# Patient Record
Sex: Female | Born: 1995
Health system: Southern US, Community
[De-identification: ages and names within clinical notes are randomized; demographics above are authoritative.]

---
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
## 2015-06-14 DIAGNOSIS — D2271 Melanocytic nevi of right lower limb, including hip: Secondary | ICD-10-CM | POA: Diagnosis not present

## 2015-06-14 DIAGNOSIS — D225 Melanocytic nevi of trunk: Secondary | ICD-10-CM | POA: Diagnosis not present

## 2015-06-14 DIAGNOSIS — D2272 Melanocytic nevi of left lower limb, including hip: Secondary | ICD-10-CM | POA: Diagnosis not present

## 2015-06-14 DIAGNOSIS — D2261 Melanocytic nevi of right upper limb, including shoulder: Secondary | ICD-10-CM | POA: Diagnosis not present

## 2015-06-14 DIAGNOSIS — D1801 Hemangioma of skin and subcutaneous tissue: Secondary | ICD-10-CM | POA: Diagnosis not present

## 2015-06-14 DIAGNOSIS — D224 Melanocytic nevi of scalp and neck: Secondary | ICD-10-CM | POA: Diagnosis not present

## 2015-06-14 DIAGNOSIS — D2262 Melanocytic nevi of left upper limb, including shoulder: Secondary | ICD-10-CM | POA: Diagnosis not present

## 2016-01-22 DIAGNOSIS — E039 Hypothyroidism, unspecified: Secondary | ICD-10-CM | POA: Diagnosis not present

## 2016-01-28 DIAGNOSIS — E039 Hypothyroidism, unspecified: Secondary | ICD-10-CM | POA: Diagnosis not present

## 2016-09-04 DIAGNOSIS — L723 Sebaceous cyst: Secondary | ICD-10-CM | POA: Diagnosis not present

## 2016-12-11 MED FILL — ARMOUR THYROID 60 MG TABLET: 60 | 30 days supply | Qty: 75 | Fill #0

## 2017-03-24 DIAGNOSIS — E559 Vitamin D deficiency, unspecified: Secondary | ICD-10-CM | POA: Diagnosis not present

## 2017-03-24 DIAGNOSIS — E039 Hypothyroidism, unspecified: Secondary | ICD-10-CM | POA: Diagnosis not present

## 2017-03-24 DIAGNOSIS — R5383 Other fatigue: Secondary | ICD-10-CM | POA: Diagnosis not present

## 2017-03-24 DIAGNOSIS — R7989 Other specified abnormal findings of blood chemistry: Secondary | ICD-10-CM | POA: Diagnosis not present

## 2017-05-11 DIAGNOSIS — M79645 Pain in left finger(s): Secondary | ICD-10-CM | POA: Diagnosis not present

## 2017-06-12 DIAGNOSIS — S99921A Unspecified injury of right foot, initial encounter: Secondary | ICD-10-CM | POA: Diagnosis not present

## 2017-06-24 DIAGNOSIS — L0390 Cellulitis, unspecified: Secondary | ICD-10-CM | POA: Diagnosis not present

## 2017-06-24 DIAGNOSIS — T63301A Toxic effect of unspecified spider venom, accidental (unintentional), initial encounter: Secondary | ICD-10-CM | POA: Diagnosis not present

## 2017-06-28 DIAGNOSIS — S30860A Insect bite (nonvenomous) of lower back and pelvis, initial encounter: Secondary | ICD-10-CM | POA: Diagnosis not present

## 2017-06-28 DIAGNOSIS — W57XXXA Bitten or stung by nonvenomous insect and other nonvenomous arthropods, initial encounter: Secondary | ICD-10-CM | POA: Diagnosis not present

## 2017-06-30 DIAGNOSIS — R21 Rash and other nonspecific skin eruption: Secondary | ICD-10-CM | POA: Diagnosis not present

## 2017-07-07 ENCOUNTER — Ambulatory Visit (INDEPENDENT_AMBULATORY_CARE_PROVIDER_SITE_OTHER): Payer: 59 | Admitting: Family Medicine

## 2017-07-07 ENCOUNTER — Encounter: Payer: Self-pay | Admitting: Family Medicine

## 2017-07-07 DIAGNOSIS — S8992XA Unspecified injury of left lower leg, initial encounter: Secondary | ICD-10-CM | POA: Diagnosis not present

## 2017-07-07 NOTE — Assessment & Plan Note (Signed)
2/2 hematoma and large contusion.  Compression icing, elevation.  Ibuprofen with tylenol as needed.  Motion exercises at least twice a day.  Get up hourly when on the plane and advised to take baby ASA each of the two days she's traveling.  F/u prn.

## 2017-07-07 NOTE — Patient Instructions (Signed)
Your ultrasound and exam are reassuring. You have a large contusion with a focal hematoma where you were struck. Compression when up and walking around. Icing 15 minutes at a time 3-4 times a day (up to every hour) - this is very important the first 72 hours but I would continue out to a week if you can. Ibuprofen 600mg  three times a day with food for pain and inflammation. Tylenol 500mg  1-2 tabs three times a day as needed. Make sure you get up every hour and walk in the plane. Motion exercises of ankle (up/downs and alphabet) twice a day at least. Call me if you need anything otherwise have a great trip!

## 2017-07-07 NOTE — Progress Notes (Signed)
PCP: No primary care provider on file.  Subjective:   HPI: Patient is a 22 y.o. female here for left leg injury.  Patient reports yesterday around 5:30-6pm she was playing ultimate frisbee when she collided with another player - their leg struck her very hard on medial left lower leg. Immediate bruising, swelling to this area. Able to bear weight but painful. Pain was up to 8/10 but now down to 3/10 with pushing off. Continues to ice and take advil. Planning to leave Monday for [place] for [employer]. No prior injuries. No numbness.  History reviewed. No pertinent past medical history.  Current Outpatient Medications on File Prior to Visit  Medication Sig Dispense Refill  . atovaquone-proguanil (MALARONE) 250-100 MG TABS tablet Take 1 tablet by mouth daily.  0  . clotrimazole (LOTRIMIN) 1 % external solution     . triamcinolone cream (KENALOG) 0.1 %      No current facility-administered medications on file prior to visit.     History reviewed. No pertinent surgical history.  Allergies  Allergen Reactions  . Peanut-Containing Drug Products Anaphylaxis    Social History   Socioeconomic History  . Marital status: Married    Spouse name: Not on file  . Number of children: Not on file  . Years of education: Not on file  . Highest education level: Not on file  Occupational History  . Not on file  Social Needs  . Financial resource strain: Not on file  . Food insecurity:    Worry: Not on file    Inability: Not on file  . Transportation needs:    Medical: Not on file    Non-medical: Not on file  Tobacco Use  . Smoking status: Not on file  Substance and Sexual Activity  . Alcohol use: Not on file  . Drug use: Not on file  . Sexual activity: Not on file  Lifestyle  . Physical activity:    Days per week: Not on file    Minutes per session: Not on file  . Stress: Not on file  Relationships  . Social connections:    Talks on phone: Not on file    Gets  together: Not on file    Attends religious service: Not on file    Active member of club or organization: Not on file    Attends meetings of clubs or organizations: Not on file    Relationship status: Not on file  . Intimate partner violence:    Fear of current or ex partner: Not on file    Emotionally abused: Not on file    Physically abused: Not on file    Forced sexual activity: Not on file  Other Topics Concern  . Not on file  Social History Narrative  . Not on file    History reviewed. No pertinent family history.  BP 122/82   Ht 5\' 6"  (1.676 m)   Wt 155 lb (70.3 kg)   BMI 25.02 kg/m   Review of Systems: See HPI above.     Objective:  Physical Exam:  Gen: NAD, comfortable in exam room  Left lower leg/ankle: Bruising throughout medial to anterior lower leg.  No other deformity. FROM ankle with 5/5 strength with minimal pain on calf raise. TTP throughout bruised area.  No rigidity. Negative ant drawer and talar tilt.   Negative syndesmotic compression. Thompsons test negative. NV intact distally.  Right lower leg/ankle: No deformity. FROM with 5/5 strength. No tenderness to palpation. NVI distally.  MSK u/s:  Venous structures compressible.  Small hematoma confirmed over medial gastroc.  No cortical irregularity, edema overlying cortex, neovascularity overlying tibial cortex.  Soft tissue swelling throughout over bruised area.  No abnormalities of gastroc or soleus.   Assessment & Plan:  1. Left leg injury - 2/2 hematoma and large contusion.  Compression icing, elevation.  Ibuprofen with tylenol as needed.  Motion exercises at least twice a day.  Get up hourly when on the plane and advised to take baby ASA each of the two days she's traveling.  F/u prn.

## 2018-09-28 DIAGNOSIS — E039 Hypothyroidism, unspecified: Secondary | ICD-10-CM | POA: Diagnosis not present

## 2018-09-28 DIAGNOSIS — Z0001 Encounter for general adult medical examination with abnormal findings: Secondary | ICD-10-CM | POA: Diagnosis not present

## 2021-01-07 DIAGNOSIS — E538 Deficiency of other specified B group vitamins: Secondary | ICD-10-CM | POA: Diagnosis not present

## 2021-01-07 DIAGNOSIS — E559 Vitamin D deficiency, unspecified: Secondary | ICD-10-CM | POA: Diagnosis not present

## 2021-01-07 DIAGNOSIS — Z0001 Encounter for general adult medical examination with abnormal findings: Secondary | ICD-10-CM | POA: Diagnosis not present

## 2021-01-07 DIAGNOSIS — E039 Hypothyroidism, unspecified: Secondary | ICD-10-CM | POA: Diagnosis not present

## 2021-04-03 DIAGNOSIS — L910 Hypertrophic scar: Secondary | ICD-10-CM | POA: Diagnosis not present

## 2021-05-09 DIAGNOSIS — L910 Hypertrophic scar: Secondary | ICD-10-CM | POA: Diagnosis not present

## 2021-06-10 DIAGNOSIS — L910 Hypertrophic scar: Secondary | ICD-10-CM | POA: Diagnosis not present
